# Patient Record
Sex: Male | Born: 2005 | Race: White | Hispanic: Yes | Marital: Single | Smoking: Never smoker
Health system: Southern US, Community
[De-identification: ages and names within clinical notes are randomized; demographics above are authoritative.]

## PROBLEM LIST (undated history)

## (undated) DIAGNOSIS — Q6689 Other  specified congenital deformities of feet: Secondary | ICD-10-CM

## (undated) HISTORY — DX: Other specified congenital deformities of feet: Q66.89

## (undated) HISTORY — PX: TYMPANOSTOMY TUBE PLACEMENT: SHX32

---
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
## 2024-03-10 ENCOUNTER — Emergency Department (HOSPITAL_BASED_OUTPATIENT_CLINIC_OR_DEPARTMENT_OTHER): Payer: Self-pay

## 2024-03-10 ENCOUNTER — Emergency Department (HOSPITAL_BASED_OUTPATIENT_CLINIC_OR_DEPARTMENT_OTHER)
Admission: EM | Admit: 2024-03-10 | Discharge: 2024-03-10 | Disposition: A | Discharge status: Home / Self Care | Payer: Self-pay | Attending: Emergency Medicine | Admitting: Emergency Medicine

## 2024-03-10 ENCOUNTER — Other Ambulatory Visit: Payer: Self-pay

## 2024-03-10 DIAGNOSIS — S20311A Abrasion of right front wall of thorax, initial encounter: Secondary | ICD-10-CM | POA: Insufficient documentation

## 2024-03-10 DIAGNOSIS — S0003XA Contusion of scalp, initial encounter: Secondary | ICD-10-CM | POA: Insufficient documentation

## 2024-03-10 DIAGNOSIS — S60221A Contusion of right hand, initial encounter: Secondary | ICD-10-CM | POA: Insufficient documentation

## 2024-03-10 MED ORDER — IBUPROFEN 400 MG PO TABS
600.0000 mg | ORAL_TABLET | Freq: Once | ORAL | Status: AC
  Administered 2024-03-10: 600 mg via ORAL
  Filled 2024-03-10: qty 1

## 2024-03-10 NOTE — Discharge Instructions (Signed)
 Ice to the swollen areas to reduce swelling. You can take ibuprofen  600 mg (3 of the over-the-counter strength tablets) every 6 hours as needed. Wear the finger splint when active, but you can remove it at home.   Return to the ED with any new or concerning symptoms for recheck at any time.
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
